# Patient Record
Sex: Female | Born: 2005 | Race: Black or African American | Hispanic: No | Marital: Single | Smoking: Never smoker
Health system: Southern US, Community
[De-identification: ages and names within clinical notes are randomized; demographics above are authoritative.]

## PROBLEM LIST (undated history)

## (undated) DIAGNOSIS — Q681 Congenital deformity of finger(s) and hand: Secondary | ICD-10-CM

## (undated) DIAGNOSIS — E301 Precocious puberty: Secondary | ICD-10-CM

## (undated) DIAGNOSIS — T7840XA Allergy, unspecified, initial encounter: Secondary | ICD-10-CM

## (undated) HISTORY — PX: ACCESSORY BONE/OSSICLE EXCISION: SHX1120

---
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
## 2024-04-16 DIAGNOSIS — J3081 Allergic rhinitis due to animal (cat) (dog) hair and dander: Secondary | ICD-10-CM | POA: Diagnosis not present

## 2024-04-16 DIAGNOSIS — L501 Idiopathic urticaria: Secondary | ICD-10-CM | POA: Diagnosis not present

## 2024-04-16 DIAGNOSIS — J45990 Exercise induced bronchospasm: Secondary | ICD-10-CM | POA: Diagnosis not present

## 2024-04-16 DIAGNOSIS — J301 Allergic rhinitis due to pollen: Secondary | ICD-10-CM | POA: Diagnosis not present

## 2024-04-16 DIAGNOSIS — J3089 Other allergic rhinitis: Secondary | ICD-10-CM | POA: Diagnosis not present
